# Patient Record
Sex: Male | Born: 1955 | Race: White | Marital: Married
Health system: Southern US, Community
[De-identification: ages and names within clinical notes are randomized; demographics above are authoritative.]

## PROBLEM LIST (undated history)

## (undated) DIAGNOSIS — E119 Type 2 diabetes mellitus without complications: Secondary | ICD-10-CM

## (undated) HISTORY — PX: TONSILLECTOMY: SUR1361

---

## 2016-03-06 ENCOUNTER — Ambulatory Visit
Admission: RE | Admit: 2016-03-06 | Discharge: 2016-03-06 | Disposition: A | Discharge status: Home / Self Care | Payer: BLUE CROSS/BLUE SHIELD | Source: Ambulatory Visit | Attending: *Deleted | Admitting: *Deleted

## 2016-03-06 ENCOUNTER — Other Ambulatory Visit: Payer: Self-pay | Admitting: *Deleted

## 2016-03-06 DIAGNOSIS — M79672 Pain in left foot: Secondary | ICD-10-CM

## 2016-05-21 ENCOUNTER — Ambulatory Visit
Admission: RE | Admit: 2016-05-21 | Discharge: 2016-05-21 | Disposition: A | Discharge status: Home / Self Care | Payer: BLUE CROSS/BLUE SHIELD | Source: Ambulatory Visit | Attending: Family Medicine | Admitting: Family Medicine

## 2016-05-21 ENCOUNTER — Other Ambulatory Visit: Payer: Self-pay | Admitting: Family Medicine

## 2016-05-21 DIAGNOSIS — R079 Chest pain, unspecified: Secondary | ICD-10-CM

## 2017-05-15 ENCOUNTER — Telehealth: Payer: Self-pay | Admitting: Acute Care

## 2017-05-19 NOTE — Telephone Encounter (Signed)
Left message for patient

## 2017-05-19 NOTE — Telephone Encounter (Signed)
LMTC x 1 for pt - I left detailed message with my office hours this week if pt can call me on his break from work to discuss lung screening

## 2017-06-02 NOTE — Telephone Encounter (Signed)
LMTC x 1  

## 2017-06-16 NOTE — Telephone Encounter (Signed)
Due to multiple unsuccessful attempts to contact pt to scheduled SDMV and CT, referral has been cancelled.  Letter was sent to pt and referring physician.  Nothing further needed.

## 2017-08-12 ENCOUNTER — Emergency Department (HOSPITAL_COMMUNITY): Payer: BLUE CROSS/BLUE SHIELD

## 2017-08-12 ENCOUNTER — Emergency Department (HOSPITAL_COMMUNITY)
Admission: EM | Admit: 2017-08-12 | Discharge: 2017-08-12 | Disposition: A | Discharge status: Home / Self Care | Payer: BLUE CROSS/BLUE SHIELD | Attending: Emergency Medicine | Admitting: Emergency Medicine

## 2017-08-12 ENCOUNTER — Encounter (HOSPITAL_COMMUNITY): Payer: Self-pay | Admitting: *Deleted

## 2017-08-12 DIAGNOSIS — R000 Tachycardia, unspecified: Secondary | ICD-10-CM | POA: Insufficient documentation

## 2017-08-12 DIAGNOSIS — Z5321 Procedure and treatment not carried out due to patient leaving prior to being seen by health care provider: Secondary | ICD-10-CM | POA: Diagnosis not present

## 2017-08-12 DIAGNOSIS — R61 Generalized hyperhidrosis: Secondary | ICD-10-CM | POA: Diagnosis not present

## 2017-08-12 HISTORY — DX: Type 2 diabetes mellitus without complications: E11.9

## 2017-08-12 LAB — BASIC METABOLIC PANEL
ANION GAP: 11 (ref 5–15)
BUN: 17 mg/dL (ref 6–20)
CALCIUM: 9.3 mg/dL (ref 8.9–10.3)
CHLORIDE: 100 mmol/L — AB (ref 101–111)
CO2: 26 mmol/L (ref 22–32)
Creatinine, Ser: 1.13 mg/dL (ref 0.61–1.24)
GFR calc Af Amer: >60 mL/min (ref >60)
GFR calc non Af Amer: >60 mL/min (ref >60)
GLUCOSE: 248 mg/dL — AB (ref 65–99)
Potassium: 3.5 mmol/L (ref 3.5–5.1)
Sodium: 137 mmol/L (ref 135–145)

## 2017-08-12 LAB — CBC
HCT: 42.7 % (ref 39.0–52.0)
HEMOGLOBIN: 14.2 g/dL (ref 13.0–17.0)
MCH: 29.2 pg (ref 26.0–34.0)
MCHC: 33.3 g/dL (ref 30.0–36.0)
MCV: 87.9 fL (ref 78.0–100.0)
Platelets: 207 10*3/uL (ref 150–400)
RBC: 4.86 MIL/uL (ref 4.22–5.81)
RDW: 13.7 % (ref 11.5–15.5)
WBC: 11 10*3/uL — ABNORMAL HIGH (ref 4.0–10.5)

## 2017-08-12 NOTE — ED Triage Notes (Signed)
To ED for eval of tachycardic/diaphoretic episode while at work today. Pt was seen by work doctor who ran ECG and told him to come to ED. No complaints at this time. HR 102 during triage assessment. Skin w/d, resp e/u

## 2017-08-14 ENCOUNTER — Ambulatory Visit
Admission: RE | Admit: 2017-08-14 | Discharge: 2017-08-14 | Disposition: A | Discharge status: Home / Self Care | Payer: BLUE CROSS/BLUE SHIELD | Source: Ambulatory Visit | Attending: Family Medicine | Admitting: Family Medicine

## 2017-08-14 ENCOUNTER — Other Ambulatory Visit: Payer: Self-pay | Admitting: Family Medicine

## 2017-08-14 DIAGNOSIS — R918 Other nonspecific abnormal finding of lung field: Secondary | ICD-10-CM

## 2018-11-22 IMAGING — DX DG CHEST 2V
2 series · 2 of 2 positions shown · non-contrast
Comparison: May 21, 2016

CLINICAL DATA: Tachycardia and diaphoresis.

EXAM:
CHEST  2 VIEW

[w chest pa]
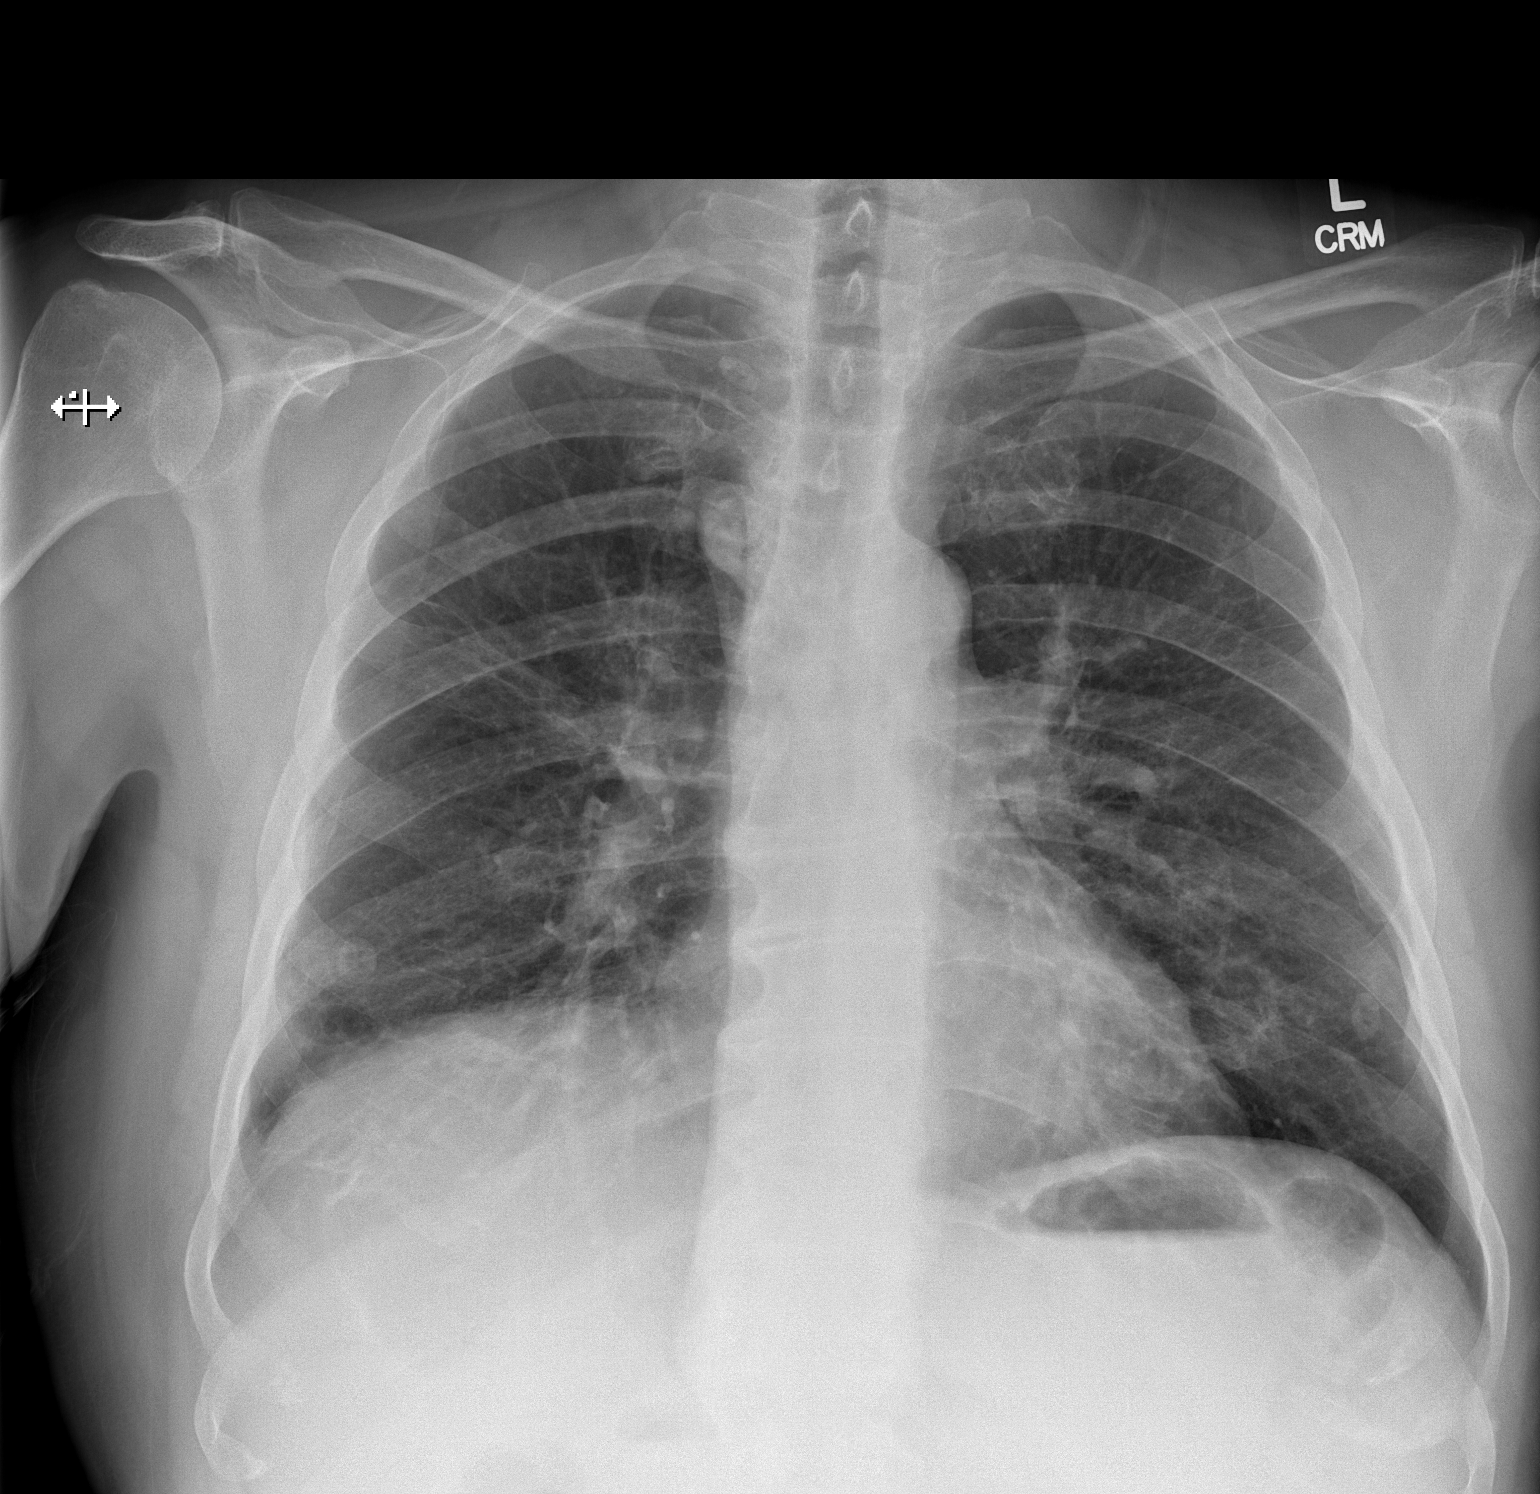

[w chest lat]
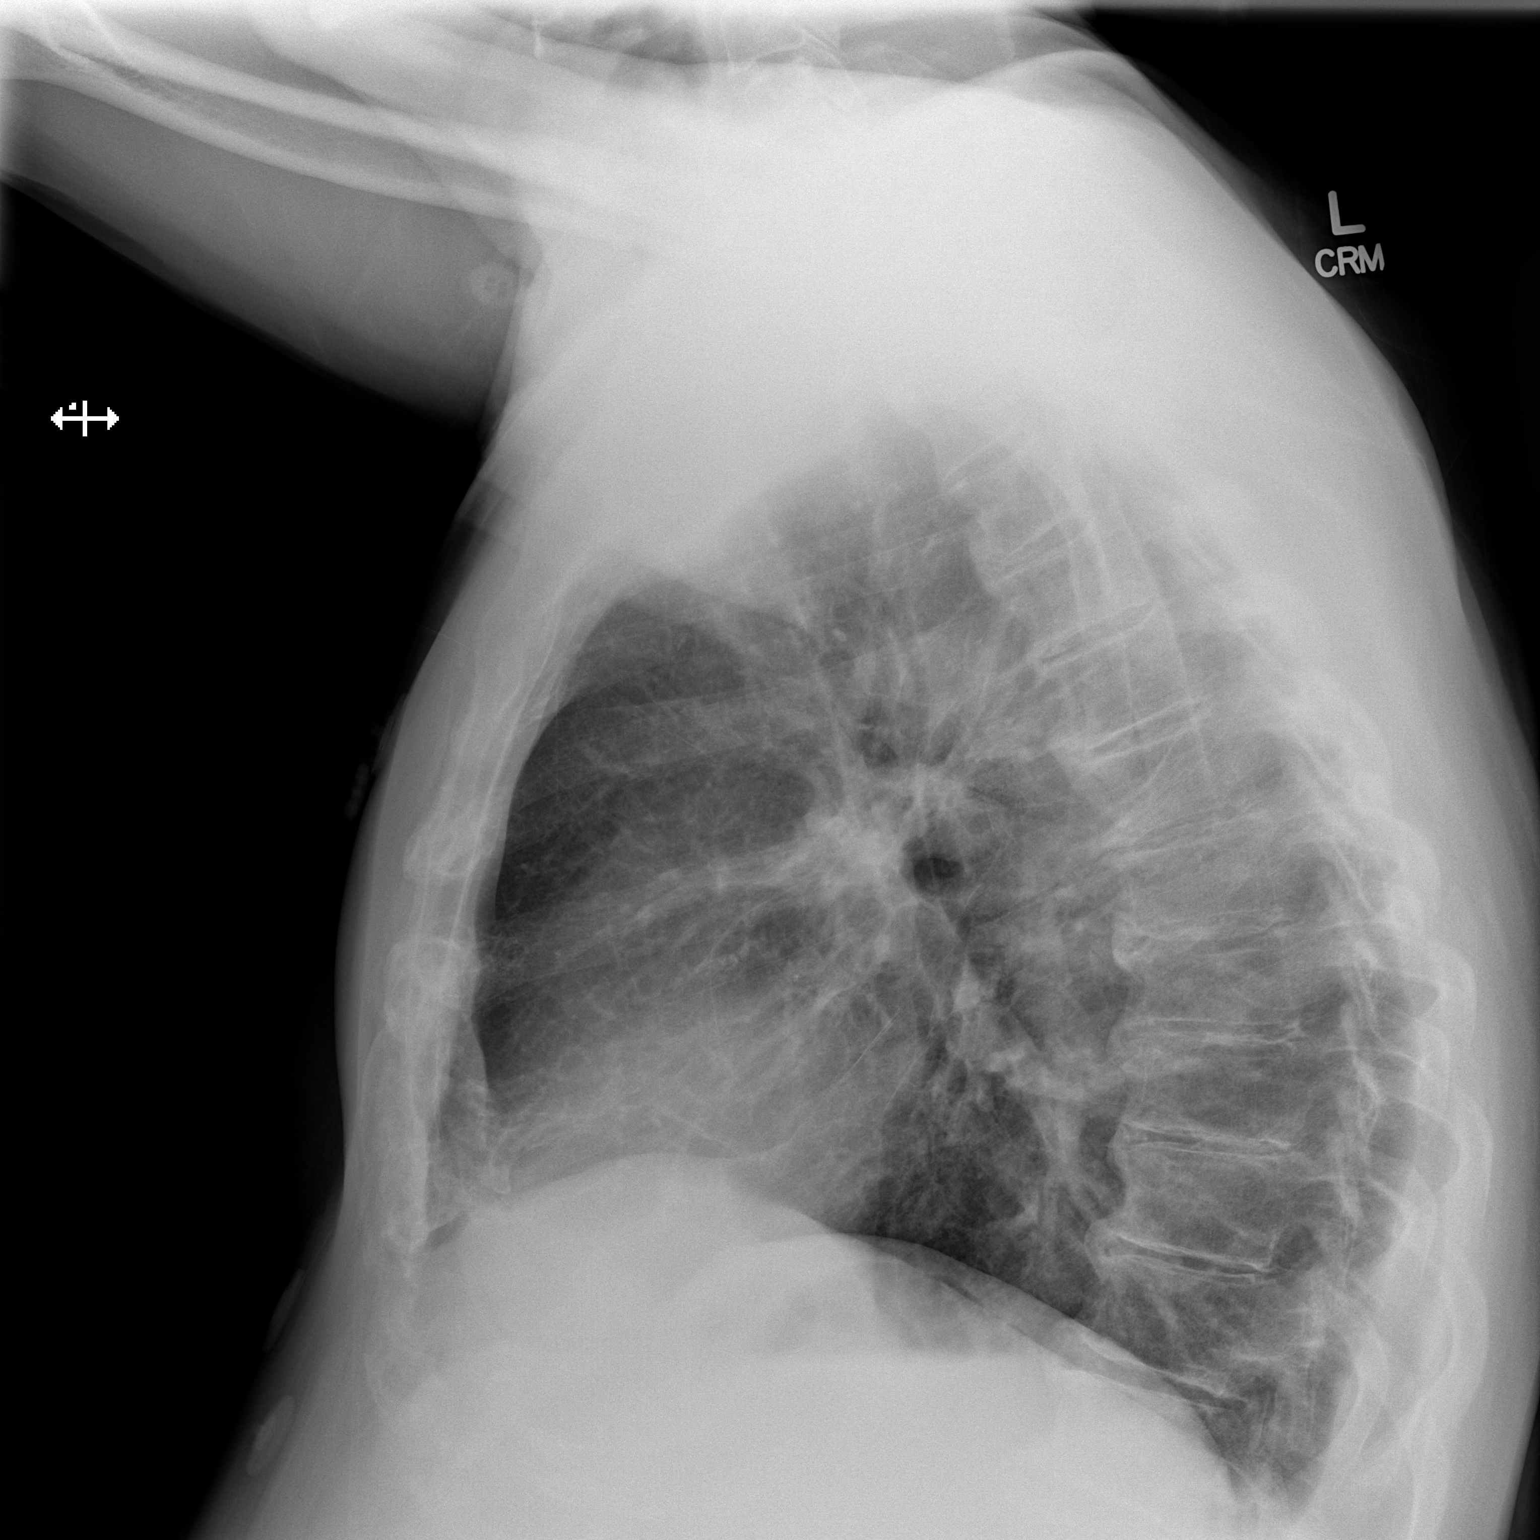

[2 of 2 positions shown; findings below may reference images not displayed]

FINDINGS: The heart, hila, and mediastinum are normal. A button projects over
the right lung base. A nodular density projects over the lateral
left lung base, not seen in 9644. No focal infiltrate. No other
acute abnormality identified.
IMPRESSION: 1. A nodular density projects over the lateral left lung base, not
seen in April 2016. Given that a button overlies the right lower
chest, the left-sided finding could be something on the patient.
Recommend a repeat film without any clothing on the chest for
further evaluation.

## 2018-11-24 IMAGING — CR DG CHEST 2V
2 series · 2 of 2 positions shown · non-contrast
Comparison: Chest x-ray of 08/12/2017

CLINICAL DATA: Recent syncopal episode, tachycardia, diaphoresis

EXAM:
CHEST  2 VIEW

[w chest pa]
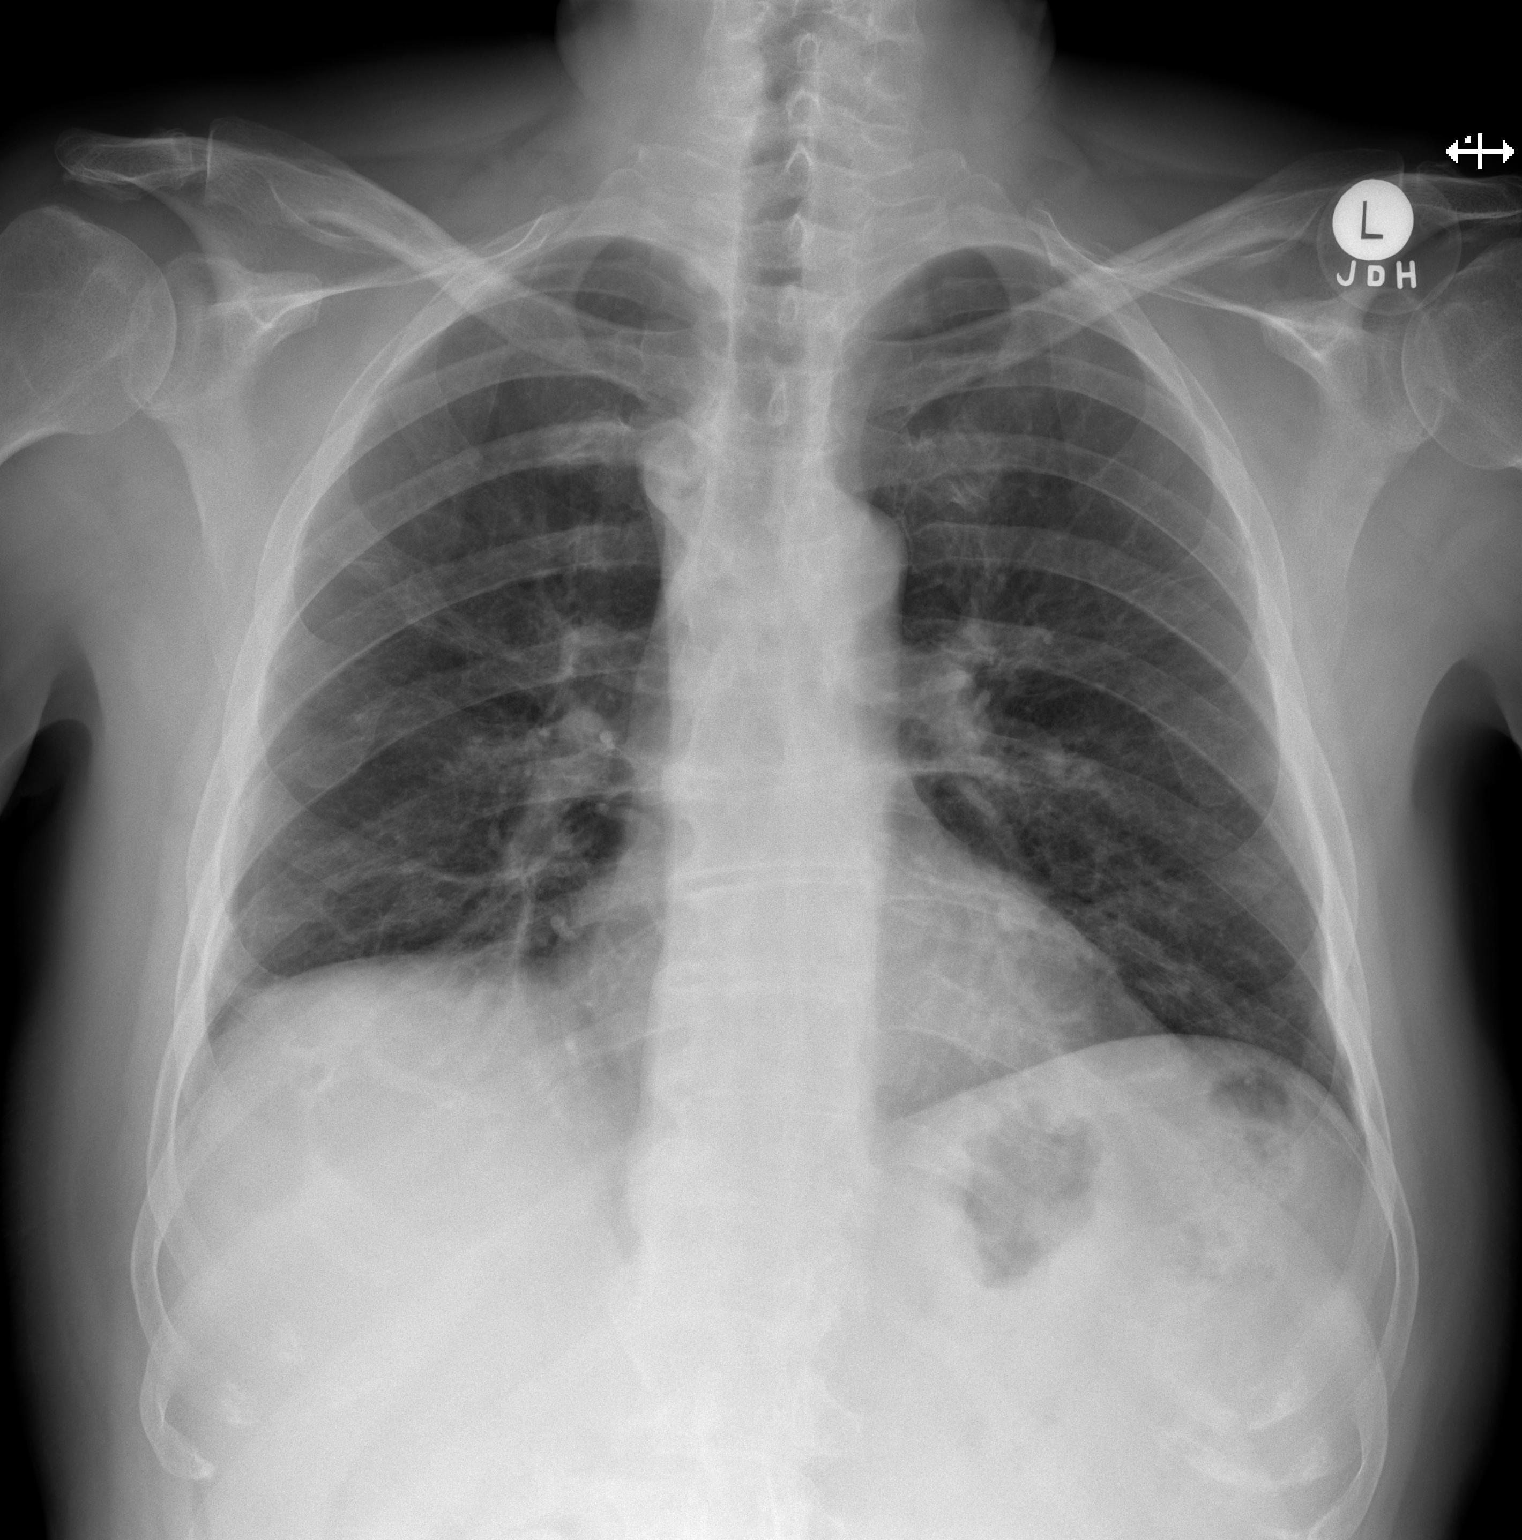

[w chest lat]
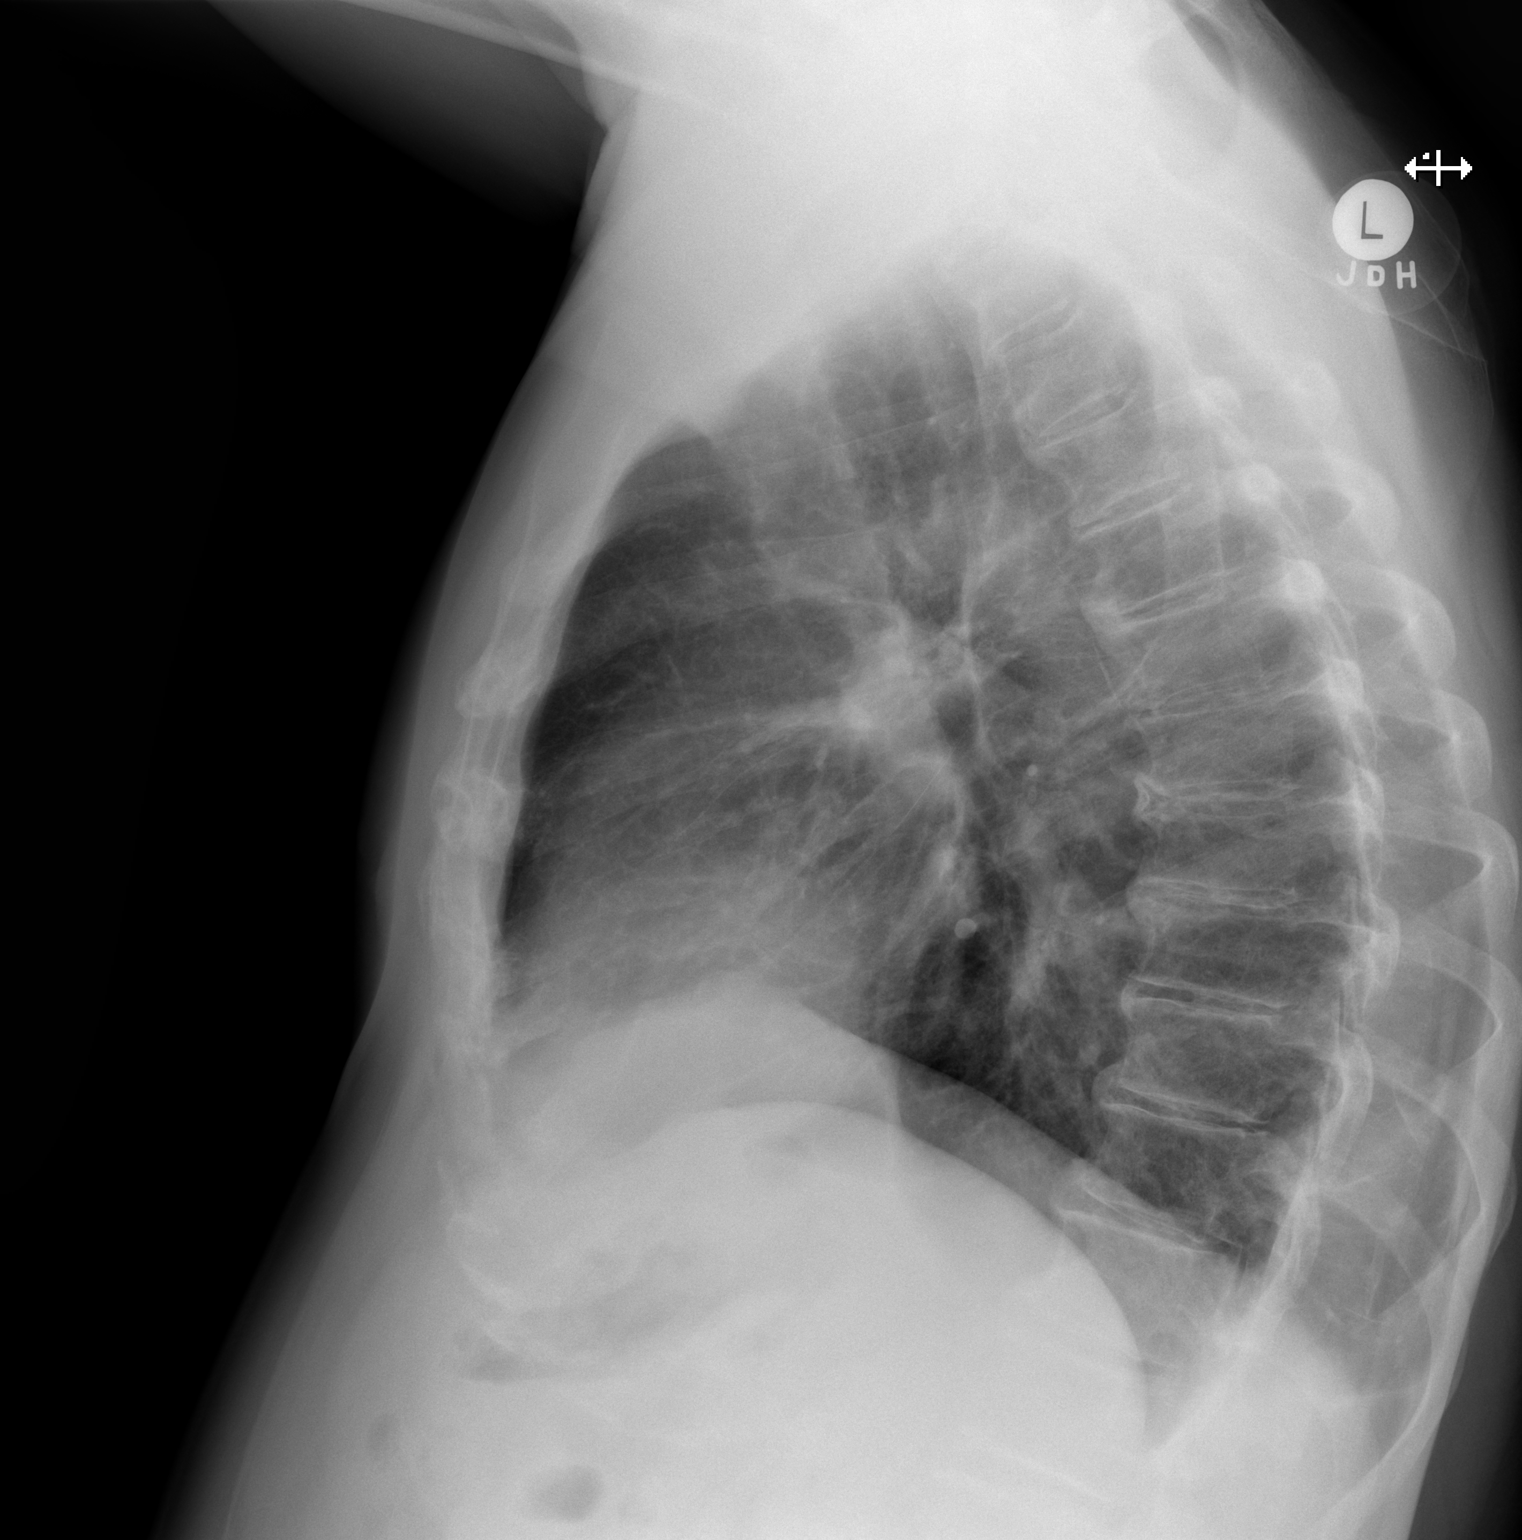

[2 of 2 positions shown; findings below may reference images not displayed]

FINDINGS: No active infiltrate or effusion is seen. Scarring is noted at the
right lung base. The rounded opacity is noted at the lung bases on
the last chest x-ray appear to have represented overlying buttons,
with no persistent nodular lesion seen. Mediastinal and hilar
contours are unremarkable. The heart is within normal limits in
size. There are degenerative changes throughout the mid to lower
thoracic spine. Degenerative spurring is noted overlying the right
paratracheal region.
IMPRESSION: 1. No active lung disease.  Stable scarring at right lung base.
2. Previous artifacts noted at the lung bases bilaterally are no
longer seen.

## 2021-12-24 ENCOUNTER — Ambulatory Visit
Admission: RE | Admit: 2021-12-24 | Discharge: 2021-12-24 | Disposition: A | Discharge status: Home / Self Care | Payer: BC Managed Care – PPO | Source: Ambulatory Visit | Attending: Internal Medicine | Admitting: Internal Medicine

## 2021-12-24 ENCOUNTER — Other Ambulatory Visit: Payer: Self-pay | Admitting: Internal Medicine

## 2021-12-24 DIAGNOSIS — M545 Low back pain, unspecified: Secondary | ICD-10-CM

## 2022-08-08 ENCOUNTER — Emergency Department (HOSPITAL_COMMUNITY): Payer: BC Managed Care – PPO

## 2022-08-08 ENCOUNTER — Emergency Department (HOSPITAL_COMMUNITY)
Admission: EM | Admit: 2022-08-08 | Discharge: 2022-08-08 | Disposition: A | Discharge status: Home / Self Care | Payer: BC Managed Care – PPO | Attending: Emergency Medicine | Admitting: Emergency Medicine

## 2022-08-08 DIAGNOSIS — E860 Dehydration: Secondary | ICD-10-CM

## 2022-08-08 DIAGNOSIS — R944 Abnormal results of kidney function studies: Secondary | ICD-10-CM | POA: Insufficient documentation

## 2022-08-08 DIAGNOSIS — I2510 Atherosclerotic heart disease of native coronary artery without angina pectoris: Secondary | ICD-10-CM | POA: Diagnosis not present

## 2022-08-08 DIAGNOSIS — R002 Palpitations: Secondary | ICD-10-CM | POA: Diagnosis present

## 2022-08-08 DIAGNOSIS — I10 Essential (primary) hypertension: Secondary | ICD-10-CM | POA: Insufficient documentation

## 2022-08-08 DIAGNOSIS — I471 Supraventricular tachycardia: Secondary | ICD-10-CM

## 2022-08-08 DIAGNOSIS — Z951 Presence of aortocoronary bypass graft: Secondary | ICD-10-CM | POA: Diagnosis not present

## 2022-08-08 LAB — COMPREHENSIVE METABOLIC PANEL
ALT: 15 U/L (ref 0–44)
AST: 19 U/L (ref 15–41)
Albumin: 3.7 g/dL (ref 3.5–5.0)
Alkaline Phosphatase: 62 U/L (ref 38–126)
Anion gap: 9 (ref 5–15)
BUN: 25 mg/dL — ABNORMAL HIGH (ref 8–23)
CO2: 20 mmol/L — ABNORMAL LOW (ref 22–32)
Calcium: 8.6 mg/dL — ABNORMAL LOW (ref 8.9–10.3)
Chloride: 106 mmol/L (ref 98–111)
Creatinine, Ser: 1.38 mg/dL — ABNORMAL HIGH (ref 0.61–1.24)
GFR, Estimated: 57 mL/min — ABNORMAL LOW (ref >60)
Glucose, Bld: 163 mg/dL — ABNORMAL HIGH (ref 70–99)
Potassium: 3.9 mmol/L (ref 3.5–5.1)
Sodium: 135 mmol/L (ref 135–145)
Total Bilirubin: 0.5 mg/dL (ref 0.3–1.2)
Total Protein: 6.3 g/dL — ABNORMAL LOW (ref 6.5–8.1)

## 2022-08-08 LAB — CBC WITH DIFFERENTIAL/PLATELET
Abs Immature Granulocytes: 0.08 10*3/uL — ABNORMAL HIGH (ref 0.00–0.07)
Basophils Absolute: 0.2 10*3/uL — ABNORMAL HIGH (ref 0.0–0.1)
Basophils Relative: 2 %
Eosinophils Absolute: 0.2 10*3/uL (ref 0.0–0.5)
Eosinophils Relative: 2 %
HCT: 38.3 % — ABNORMAL LOW (ref 39.0–52.0)
Hemoglobin: 13 g/dL (ref 13.0–17.0)
Immature Granulocytes: 1 %
Lymphocytes Relative: 11 %
Lymphs Abs: 1.1 10*3/uL (ref 0.7–4.0)
MCH: 30.3 pg (ref 26.0–34.0)
MCHC: 33.9 g/dL (ref 30.0–36.0)
MCV: 89.3 fL (ref 80.0–100.0)
Monocytes Absolute: 0.9 10*3/uL (ref 0.1–1.0)
Monocytes Relative: 9 %
Neutro Abs: 7.2 10*3/uL (ref 1.7–7.7)
Neutrophils Relative %: 75 %
Platelets: 216 10*3/uL (ref 150–400)
RBC: 4.29 MIL/uL (ref 4.22–5.81)
RDW: 14.1 % (ref 11.5–15.5)
WBC: 9.6 10*3/uL (ref 4.0–10.5)
nRBC: 0 % (ref 0.0–0.2)

## 2022-08-08 LAB — BRAIN NATRIURETIC PEPTIDE: B Natriuretic Peptide: 113.3 pg/mL — ABNORMAL HIGH (ref 0.0–100.0)

## 2022-08-08 LAB — MAGNESIUM: Magnesium: 1.8 mg/dL (ref 1.7–2.4)

## 2022-08-08 MED ORDER — LACTATED RINGERS IV BOLUS
1000.0000 mL | Freq: Once | INTRAVENOUS | Status: AC
  Administered 2022-08-08: 1000 mL via INTRAVENOUS

## 2022-08-08 MED ORDER — METOPROLOL TARTRATE 5 MG/5ML IV SOLN
5.0000 mg | Freq: Once | INTRAVENOUS | Status: AC
  Administered 2022-08-08: 5 mg via INTRAVENOUS
  Filled 2022-08-08: qty 5

## 2022-08-08 MED ORDER — METOPROLOL TARTRATE 25 MG PO TABS: 25.0000 mg | ORAL_TABLET | Freq: Once | ORAL | Status: DC

## 2022-08-08 NOTE — ED Provider Notes (Signed)
[facility] EMERGENCY DEPARTMENT Provider Note   CSN: [id number] Arrival date & time: 08/08/22  1254     History  Chief Complaint  Patient presents with   Palpitations    Bruce Esparza is a 66 y.o. male.with pmh CAD s/p CABG, A fib, HTN, HLD who presents with palpitations.  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  He says this is happened to him in the past and typically if he just sits down and breathes deeply for a few minutes it goes away.  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  He denies any associated symptoms, no nausea, no diaphoresis, no vomiting, no chest pain, no shortness of breath, no leg pain or swelling.  His wife thinks he may have some mild dementia and forgets to take his medications at time although he does endorse taking his metoprolol this morning for A-fib.   Palpitations      Home Medications Prior to Admission medications   Not on File      Allergies    Patient has no known allergies.    Review of Systems   Review of Systems  Cardiovascular:  Positive for palpitations.    Physical Exam Updated Vital Signs BP 113/68   Pulse 73   Temp 98 F (36.7 C) (Oral)   Resp 17   SpO2 97%  Physical Exam Constitutional: Alert and oriented.  Elderly appearing male who is sitting up in bed nontoxic, NAD, acting appropriately  eyes: Conjunctivae are normal. ENT      Head: Normocephalic and atraumatic.      Nose: No congestion.      Mouth/Throat: Mucous membranes are moist.      Neck: No stridor. Cardiovascular: S1, S2, tachycardic, warm and well-perfused Respiratory: Normal respiratory effort. Breath sounds are normal.  O2 sat 98 on RA. Gastrointestinal: Soft and nontender. There is no CVA tenderness. Musculoskeletal: Normal range of motion in all extremities. No edema of the lower  extremities. Neurologic: Normal speech and language. No gross focal neurologic deficits are appreciated. Skin: Skin is warm, dry and intact. No rash noted. Psychiatric: Mood and affect are normal. Speech and behavior are normal.  ED Results / Procedures / Treatments   Labs (all labs ordered are listed, but only abnormal results are displayed) Labs Reviewed  BRAIN NATRIURETIC PEPTIDE - Abnormal; Notable for the following components:      Result Value   B Natriuretic Peptide 113.3 (*)    All other components within normal limits  COMPREHENSIVE METABOLIC PANEL - Abnormal; Notable for the following components:   CO2 20 (*)    Glucose, Bld 163 (*)    BUN 25 (*)    Creatinine, Ser 1.38 (*)    Calcium 8.6 (*)    Total Protein 6.3 (*)    GFR, Estimated 57 (*)    All other components within normal limits  CBC WITH DIFFERENTIAL/PLATELET - Abnormal; Notable for the following components:   HCT 38.3 (*)    Basophils Absolute 0.2 (*)    Abs Immature Granulocytes 0.08 (*)    All other components within normal limits  MAGNESIUM    EKG EKG Interpretation  Date/Time:  Thursday August 08 2022 13:48:57 EDT Ventricular Rate:  74 PR Interval:  172 QRS Duration: 96 QT Interval:  395  QTC Calculation: 439 R Axis:   82 Text Interpretation: Sinus rhythm Borderline right axis deviation Anteroseptal infarct, old No acute ischemic change Confirmed by Vivien Rossetti ([id number]) on 08/08/2022 1:50:27 PM  Radiology DG Chest Portable 1 View  Result Date: 08/08/2022 CLINICAL DATA:  Tachycardia EXAM: PORTABLE CHEST 1 VIEW COMPARISON:  Radiograph 08/14/2017 FINDINGS: Unchanged cardiomediastinal silhouette prior median sternotomy and postsurgical changes of CABG. Trace right pleural effusion. Bibasilar subsegmental atelectasis. No confluent airspace consolidation. No evidence of pneumothorax. No acute osseous abnormality. IMPRESSION: Trace right pleural effusion and bibasilar subsegmental atelectasis.  Electronically Signed   By: Caprice Renshaw M.D.   On: 08/08/2022 13:43    Procedures Procedures  Remain on constant cardiac monitoring, initial junctional tachycardia resolved to normal sinus rhythm.  Medications Ordered in ED Medications  metoprolol tartrate (LOPRESSOR) tablet 25 mg (25 mg Oral Patient Refused/Not Given 08/08/22 1553)  metoprolol tartrate (LOPRESSOR) injection 5 mg (5 mg Intravenous Given 08/08/22 1343)  lactated ringers bolus 1,000 mL (0 mLs Intravenous Stopped 08/08/22 1539)    ED Course/ Medical Decision Making/ A&P Clinical Course as of 08/08/22 1601  Thu Aug 08, 2022  1347 Personal interpretation of patient's chest x-ray shows very small right pleural effusion agree with radiology read. [VB]  1349 Reassessed patient, he is now back in normal sinus rhythm rate 70s.  He feels much better.  He would like to go home.  I told him we just need to observe him and check his basic blood work. [VB]    Clinical Course User Index [VB] Mardene Sayer, MD                           Medical Decision Making Bruce Esparza is a 66 y.o. male.with pmh CAD s/p CABG, A fib, HTN, HLD who presents with palpitations.  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    Patient presented with what appeared to be a junctional tachycardia with rates high 120s low 130s.  Otherwise maintained stable blood pressure, mentation and was not unstable.  He had no other associated concerning symptoms and no active chest pain, no concern for ACS.  He has known A-fib on metoprolol and has been intermittently missing doses.  He was given IV metoprolol 5 mg with IV fluids and converted to normal sinus rhythm with no acute ischemic change and normal QT interval 439.  Labs were obtained with creatinine slightly elevated 1.38.  Normal sodium 135 and potassium 3.9.  BNP slightly elevated 113 but no evidence of peripheral edema  and chest x-ray obtained with only a trace right pleural effusion but no hypoxia  Patient advised to continue his home metoprolol without missing doses and follow-up with a cardiologist and strict return precautions.  He was in agreement with plan and safe for discharge.  Amount and/or Complexity of Data Reviewed Labs: ordered. Radiology: ordered.  Risk Prescription drug management.    Final Clinical Impression(s) / ED Diagnoses Final diagnoses:  Palpitations  Dehydration  Junctional tachycardia [facility])    Rx / DC Orders ED Discharge Orders     None         Mardene Sayer, MD 08/08/22 [phone]

## 2022-08-08 NOTE — Discharge Instructions (Addendum)
You were seen in the emergency department for abnormal heart rhythm and fast rate.  We gave you IV medication that brought your heart rate back to a normal rhythm and heart rate.  Make sure to take your home metoprolol as prescribed.  You are slightly dehydrated and given IV fluids in the ER.  Follow-up with your cardiologist and primary care doctor regarding your visit to the ER today.  Come back for any return of fast heart rate, palpitations, chest pain, shortness of breath or any other symptoms concerning to you.

## 2022-08-08 NOTE — ED Triage Notes (Signed)
Patient BIB [facility] from [facility] after coming in for heart palpitations. Patient has significant cardiac hx, found to be in accelerated junctional rhythm. Patient only complains of palpitations. Pt HR 128, BP with EMS 106/68 after NS. A&Ox4

## 2024-12-29 ENCOUNTER — Other Ambulatory Visit
